# Patient Record
Sex: Male | Born: 1950
Health system: Southern US, Community
[De-identification: ages and names within clinical notes are randomized; demographics above are authoritative.]

## PROBLEM LIST (undated history)

## (undated) DIAGNOSIS — I10 Essential (primary) hypertension: Secondary | ICD-10-CM

## (undated) HISTORY — PX: HERNIA REPAIR: SHX51

---

## 2002-09-15 ENCOUNTER — Ambulatory Visit (HOSPITAL_COMMUNITY): Admission: RE | Admit: 2002-09-15 | Discharge: 2002-09-15 | Payer: Self-pay | Admitting: Internal Medicine

## 2002-09-15 ENCOUNTER — Encounter: Payer: Self-pay | Admitting: Internal Medicine

## 2003-03-05 ENCOUNTER — Ambulatory Visit (HOSPITAL_COMMUNITY): Admission: RE | Admit: 2003-03-05 | Discharge: 2003-03-05 | Payer: Self-pay | Admitting: Otolaryngology

## 2004-08-02 ENCOUNTER — Ambulatory Visit: Payer: Self-pay | Admitting: Internal Medicine

## 2004-08-14 ENCOUNTER — Ambulatory Visit: Payer: Self-pay | Admitting: Internal Medicine

## 2004-08-14 ENCOUNTER — Ambulatory Visit: Payer: Self-pay | Admitting: *Deleted

## 2004-09-06 ENCOUNTER — Ambulatory Visit: Payer: Self-pay | Admitting: Internal Medicine

## 2017-11-03 ENCOUNTER — Encounter (HOSPITAL_BASED_OUTPATIENT_CLINIC_OR_DEPARTMENT_OTHER): Payer: Self-pay | Admitting: Emergency Medicine

## 2017-11-03 ENCOUNTER — Other Ambulatory Visit: Payer: Self-pay

## 2017-11-03 ENCOUNTER — Emergency Department (HOSPITAL_BASED_OUTPATIENT_CLINIC_OR_DEPARTMENT_OTHER)
Admission: EM | Admit: 2017-11-03 | Discharge: 2017-11-03 | Disposition: A | Discharge status: Home / Self Care | Payer: BLUE CROSS/BLUE SHIELD | Attending: Emergency Medicine | Admitting: Emergency Medicine

## 2017-11-03 DIAGNOSIS — K4091 Unilateral inguinal hernia, without obstruction or gangrene, recurrent: Secondary | ICD-10-CM | POA: Insufficient documentation

## 2017-11-03 DIAGNOSIS — R109 Unspecified abdominal pain: Secondary | ICD-10-CM | POA: Diagnosis present

## 2017-11-03 DIAGNOSIS — I10 Essential (primary) hypertension: Secondary | ICD-10-CM | POA: Diagnosis not present

## 2017-11-03 HISTORY — DX: Essential (primary) hypertension: I10

## 2017-11-03 LAB — BASIC METABOLIC PANEL
Anion gap: 10 (ref 5–15)
BUN: 15 mg/dL (ref 6–20)
CO2: 29 mmol/L (ref 22–32)
Calcium: 9.7 mg/dL (ref 8.9–10.3)
Chloride: 100 mmol/L — ABNORMAL LOW (ref 101–111)
Creatinine, Ser: 1.01 mg/dL (ref 0.61–1.24)
GFR calc Af Amer: >60 mL/min (ref >60)
GFR calc non Af Amer: >60 mL/min (ref >60)
Glucose, Bld: 111 mg/dL — ABNORMAL HIGH (ref 65–99)
Potassium: 3.7 mmol/L (ref 3.5–5.1)
Sodium: 139 mmol/L (ref 135–145)

## 2017-11-03 LAB — CBC WITH DIFFERENTIAL/PLATELET
Basophils Absolute: 0 10*3/uL (ref 0.0–0.1)
Basophils Relative: 0 %
Eosinophils Absolute: 0.1 10*3/uL (ref 0.0–0.7)
Eosinophils Relative: 1 %
HCT: 43.4 % (ref 39.0–52.0)
Hemoglobin: 15.2 g/dL (ref 13.0–17.0)
Lymphocytes Relative: 14 %
Lymphs Abs: 0.8 10*3/uL (ref 0.7–4.0)
MCH: 33.9 pg (ref 26.0–34.0)
MCHC: 35 g/dL (ref 30.0–36.0)
MCV: 96.9 fL (ref 78.0–100.0)
Monocytes Absolute: 0.5 10*3/uL (ref 0.1–1.0)
Monocytes Relative: 8 %
Neutro Abs: 4.4 10*3/uL (ref 1.7–7.7)
Neutrophils Relative %: 77 %
Platelets: 165 10*3/uL (ref 150–400)
RBC: 4.48 MIL/uL (ref 4.22–5.81)
RDW: 11.5 % (ref 11.5–15.5)
WBC: 5.8 10*3/uL (ref 4.0–10.5)

## 2017-11-03 NOTE — Discharge Instructions (Signed)
Please read and follow all provided instructions.  Your diagnoses today include:  1. Unilateral recurrent inguinal hernia without obstruction or gangrene     Tests performed today include:  Blood counts and electrolytes  Blood tests to kidney function  Vital signs. See below for your results today.   Medications prescribed:   None  Take any prescribed medications only as directed.  Home care instructions:   Follow any educational materials contained in this packet.  Follow-up instructions: Please follow-up with the general surgeon listed for further evaluation of your hernia.    Return instructions:  SEEK IMMEDIATE MEDICAL ATTENTION IF:  The pain does not go away or becomes severe   A temperature above 101F develops   Repeated vomiting occurs (multiple episodes)   The pain becomes localized to portions of the abdomen. The right side could possibly be appendicitis. In an adult, the left lower portion of the abdomen could be colitis or diverticulitis.   Blood is being passed in stools or vomit (bright red or black tarry stools)   You develop chest pain, difficulty breathing, dizziness or fainting, or become confused, poorly responsive, or inconsolable (young children)  If you have any other emergent concerns regarding your health  Additional Information: Abdominal (belly) pain can be caused by many things. Your caregiver performed an examination and possibly ordered blood/urine tests and imaging (CT scan, x-rays, ultrasound). Many cases can be observed and treated at home after initial evaluation in the emergency department. Even though you are being discharged home, abdominal pain can be unpredictable. Therefore, you need a repeated exam if your pain does not resolve, returns, or worsens. Most patients with abdominal pain don't have to be admitted to the hospital or have surgery, but serious problems like appendicitis and gallbladder attacks can start out as nonspecific  pain. Many abdominal conditions cannot be diagnosed in one visit, so follow-up evaluations are very important.  Your vital signs today were: BP (!) 142/82 (BP Location: Right Arm)    Pulse 60    Temp 97.9 F (36.6 C) (Oral)    Resp 18    Ht 5\' 8"  (1.727 m)    Wt 76.2 kg (168 lb)    SpO2 100%    BMI 25.54 kg/m  If your blood pressure (bp) was elevated above 135/85 this visit, please have this repeated by your doctor within one month. --------------

## 2017-11-03 NOTE — ED Provider Notes (Signed)
[facility] EMERGENCY DEPARTMENT Provider Note   CSN: [id number] Arrival date & time: 11/03/17  0946     History   Chief Complaint Chief Complaint  Patient presents with  . Abdominal Pain    HPI Raymond Mcmillan is a 67 y.o. male.  Patient presents to the emergency department with complaint of left hernia pain.  Patient reports having a left inguinal hernia present since he was a child.  For many years this did not cause him any problems.  Moved in and out freely without difficulty or pain.  Acutely this morning, had severe pain in the left groin area that caused him to break out into a sweat.  He did not have any nausea or vomiting.  The pain was temporary and improved.  Patient noted that the hernia was able to move in and out as normal.  He presents to the emergency department for evaluation.  Currently he has no pain.  Patient denies any recent fevers, urinary symptoms, diarrhea.  He does report jogging and playing tennis on a regular basis.  He has done sit ups recently which he thinks may have exacerbated his symptoms.  He has never seen a Careers advisersurgeon for this.  Currently his primary care physician is monitoring the area. The onset of this condition was acute. The course is resolved. Alleviating factors: none.        Past Medical History:  Diagnosis Date  . Hypertension     There are no active problems to display for this patient.   Past Surgical History:  Procedure Laterality Date  . HERNIA REPAIR         Home Medications    Prior to Admission medications   Medication Sig Start Date End Date Taking? Authorizing Provider  lisinopril-hydrochlorothiazide (PRINZIDE,ZESTORETIC) 20-12.5 MG tablet Take 1 tablet by mouth daily.   Yes [provider]    Family History No family history on file.  Social History Social History   Tobacco Use  . Smoking status: Never Smoker  . Smokeless tobacco: Never Used  Substance Use Topics  . Alcohol use: Yes  .  Drug use: No     Allergies   Patient has no known allergies.   Review of Systems Review of Systems  Constitutional: Negative for fever.  HENT: Negative for rhinorrhea and sore throat.   Eyes: Negative for redness.  Respiratory: Negative for cough.   Cardiovascular: Negative for chest pain.  Gastrointestinal: Positive for abdominal pain. Negative for diarrhea, nausea and vomiting.  Genitourinary: Negative for dysuria.  Musculoskeletal: Negative for myalgias.  Skin: Negative for rash.  Neurological: Negative for headaches.     Physical Exam Updated Vital Signs BP (!) 142/82 (BP Location: Right Arm)   Pulse 60   Temp 97.9 F (36.6 C) (Oral)   Resp 18   Ht 5\' 8"  (1.727 m)   Wt 76.2 kg (168 lb)   SpO2 100%   BMI 25.54 kg/m   Physical Exam  Constitutional: He appears well-developed and well-nourished.  HENT:  Head: Normocephalic and atraumatic.  Eyes: Conjunctivae are normal. Right eye exhibits no discharge. Left eye exhibits no discharge.  Neck: Normal range of motion. Neck supple.  Cardiovascular: Normal rate, regular rhythm and normal heart sounds.  Pulmonary/Chest: Effort normal and breath sounds normal.  Abdominal: Soft. There is no tenderness. There is no rigidity, no rebound, no guarding, no CVA tenderness, no tenderness at McBurney's point and negative Murphy's sign. A hernia is present. Hernia confirmed positive in the  left inguinal area (Readily reducible with slight pressure).  Neurological: He is alert.  Skin: Skin is warm and dry.  Psychiatric: He has a normal mood and affect.  Nursing note and vitals reviewed.    ED Treatments / Results  Labs (all labs ordered are listed, but only abnormal results are displayed) Labs Reviewed  CBC WITH DIFFERENTIAL/PLATELET  BASIC METABOLIC PANEL    EKG  EKG Interpretation None       Radiology No results found.  Procedures Procedures (including critical care time)  Medications Ordered in ED Medications  - No data to display   Initial Impression / Assessment and Plan / ED Course  I have reviewed the triage vital signs and the nursing notes.  Pertinent labs & imaging results that were available during my care of the patient were reviewed by me and considered in my medical decision making (see chart for details).     Patient seen and examined.  Patient without any current signs of strangulation or herniation.  Patient and I had a good discussion about how to proceed with further evaluation and possible repair in the future.  Discussed that given his current exam, there is no indications to emergently repair this today.  I do not feel at this time that he requires advanced imaging without signs of incarceration or strangulation.  Will check a CBC and chemistry.  Suspect patient will be able to be discharged home with surgery referral and return instructions if negative.  Vital signs reviewed and are as follows: BP (!) 142/82 (BP Location: Right Arm)   Pulse 60   Temp 97.9 F (36.6 C) (Oral)   Resp 18   Ht 5\' 8"  (1.727 m)   Wt 76.2 kg (168 lb)   SpO2 100%   BMI 25.54 kg/m   10:52 AM labs reassuring.  Patient informed.  Discharge plan as above.  Final Clinical Impressions(s) / ED Diagnoses   Final diagnoses:  Unilateral recurrent inguinal hernia without obstruction or gangrene   Patient with pain this morning from a readily reducible inguinal hernia on the left without signs of strangulation or incarceration.  Patient will need general surgery follow-up.  No signs of obstruction.  Labs are reassuring.  ED Discharge Orders    None       Renne Crigler, Cordelia Poche 11/03/17 1053    Little, Ambrose Finland, MD 11/04/17 1558

## 2017-11-03 NOTE — ED Triage Notes (Signed)
Pt states he has abd pain from a hernia that is pushing through. Pt reports the pain was so bad this morning he was diaphoretic.

## 2018-02-06 DIAGNOSIS — Z0000 Encounter for general adult medical examination without abnormal findings: Secondary | ICD-10-CM | POA: Diagnosis not present

## 2018-03-26 DIAGNOSIS — H6123 Impacted cerumen, bilateral: Secondary | ICD-10-CM | POA: Diagnosis not present

## 2018-04-29 DIAGNOSIS — N390 Urinary tract infection, site not specified: Secondary | ICD-10-CM | POA: Diagnosis not present

## 2018-04-29 DIAGNOSIS — K4091 Unilateral inguinal hernia, without obstruction or gangrene, recurrent: Secondary | ICD-10-CM | POA: Diagnosis not present

## 2018-04-29 DIAGNOSIS — I10 Essential (primary) hypertension: Secondary | ICD-10-CM | POA: Diagnosis not present

## 2018-04-29 DIAGNOSIS — Z125 Encounter for screening for malignant neoplasm of prostate: Secondary | ICD-10-CM | POA: Diagnosis not present

## 2018-04-29 DIAGNOSIS — R21 Rash and other nonspecific skin eruption: Secondary | ICD-10-CM | POA: Diagnosis not present

## 2018-09-02 DIAGNOSIS — K4090 Unilateral inguinal hernia, without obstruction or gangrene, not specified as recurrent: Secondary | ICD-10-CM | POA: Diagnosis not present

## 2018-09-02 DIAGNOSIS — I10 Essential (primary) hypertension: Secondary | ICD-10-CM | POA: Diagnosis not present

## 2018-09-15 DIAGNOSIS — E785 Hyperlipidemia, unspecified: Secondary | ICD-10-CM | POA: Diagnosis not present

## 2018-09-15 DIAGNOSIS — I10 Essential (primary) hypertension: Secondary | ICD-10-CM | POA: Diagnosis not present

## 2019-03-09 DIAGNOSIS — I10 Essential (primary) hypertension: Secondary | ICD-10-CM | POA: Diagnosis not present

## 2019-03-09 DIAGNOSIS — Z7189 Other specified counseling: Secondary | ICD-10-CM | POA: Diagnosis not present

## 2019-03-09 DIAGNOSIS — E785 Hyperlipidemia, unspecified: Secondary | ICD-10-CM | POA: Diagnosis not present

## 2019-03-12 DIAGNOSIS — E785 Hyperlipidemia, unspecified: Secondary | ICD-10-CM | POA: Diagnosis not present

## 2019-03-12 DIAGNOSIS — I10 Essential (primary) hypertension: Secondary | ICD-10-CM | POA: Diagnosis not present

## 2019-04-11 DIAGNOSIS — K648 Other hemorrhoids: Secondary | ICD-10-CM | POA: Diagnosis not present

## 2019-07-20 DIAGNOSIS — M542 Cervicalgia: Secondary | ICD-10-CM | POA: Diagnosis not present

## 2019-07-27 DIAGNOSIS — M542 Cervicalgia: Secondary | ICD-10-CM | POA: Diagnosis not present

## 2019-07-28 ENCOUNTER — Other Ambulatory Visit: Payer: Self-pay | Admitting: Family Medicine

## 2019-07-28 ENCOUNTER — Ambulatory Visit
Admission: RE | Admit: 2019-07-28 | Discharge: 2019-07-28 | Disposition: A | Discharge status: Home / Self Care | Payer: Medicare Other | Source: Ambulatory Visit | Attending: Family Medicine | Admitting: Family Medicine

## 2019-07-28 ENCOUNTER — Other Ambulatory Visit: Payer: Self-pay

## 2019-07-28 DIAGNOSIS — M542 Cervicalgia: Secondary | ICD-10-CM | POA: Diagnosis not present

## 2019-08-03 DIAGNOSIS — M542 Cervicalgia: Secondary | ICD-10-CM | POA: Diagnosis not present

## 2019-08-03 DIAGNOSIS — Z0000 Encounter for general adult medical examination without abnormal findings: Secondary | ICD-10-CM | POA: Diagnosis not present

## 2019-12-26 DIAGNOSIS — M542 Cervicalgia: Secondary | ICD-10-CM | POA: Diagnosis not present

## 2020-01-09 DIAGNOSIS — R51 Headache with orthostatic component, not elsewhere classified: Secondary | ICD-10-CM | POA: Diagnosis not present

## 2020-01-12 ENCOUNTER — Other Ambulatory Visit: Payer: Self-pay | Admitting: Family Medicine

## 2020-01-12 DIAGNOSIS — R519 Headache, unspecified: Secondary | ICD-10-CM

## 2020-01-23 DIAGNOSIS — R51 Headache with orthostatic component, not elsewhere classified: Secondary | ICD-10-CM | POA: Diagnosis not present

## 2020-01-25 ENCOUNTER — Other Ambulatory Visit: Payer: Self-pay | Admitting: Family Medicine

## 2020-01-25 ENCOUNTER — Ambulatory Visit
Admission: RE | Admit: 2020-01-25 | Discharge: 2020-01-25 | Disposition: A | Discharge status: Home / Self Care | Payer: Medicare Other | Source: Ambulatory Visit | Attending: Family Medicine | Admitting: Family Medicine

## 2020-01-25 ENCOUNTER — Other Ambulatory Visit: Payer: Self-pay

## 2020-01-25 DIAGNOSIS — R519 Headache, unspecified: Secondary | ICD-10-CM | POA: Diagnosis not present

## 2020-02-05 ENCOUNTER — Other Ambulatory Visit: Payer: Medicare Other

## 2020-02-05 ENCOUNTER — Ambulatory Visit
Admission: RE | Admit: 2020-02-05 | Discharge: 2020-02-05 | Disposition: A | Discharge status: Home / Self Care | Payer: Medicare Other | Source: Ambulatory Visit | Attending: Family Medicine | Admitting: Family Medicine

## 2020-02-05 DIAGNOSIS — E042 Nontoxic multinodular goiter: Secondary | ICD-10-CM | POA: Diagnosis not present

## 2020-02-05 DIAGNOSIS — R519 Headache, unspecified: Secondary | ICD-10-CM

## 2020-02-05 MED ORDER — IOPAMIDOL (ISOVUE-370) INJECTION 76%
75.0000 mL | Freq: Once | INTRAVENOUS | Status: AC | PRN
  Administered 2020-02-05: 75 mL via INTRAVENOUS

## 2020-02-10 DIAGNOSIS — E042 Nontoxic multinodular goiter: Secondary | ICD-10-CM | POA: Diagnosis not present

## 2020-02-10 DIAGNOSIS — E039 Hypothyroidism, unspecified: Secondary | ICD-10-CM | POA: Diagnosis not present

## 2020-02-10 DIAGNOSIS — R519 Headache, unspecified: Secondary | ICD-10-CM | POA: Diagnosis not present

## 2020-04-15 DIAGNOSIS — I10 Essential (primary) hypertension: Secondary | ICD-10-CM | POA: Diagnosis not present

## 2020-04-15 DIAGNOSIS — E785 Hyperlipidemia, unspecified: Secondary | ICD-10-CM | POA: Diagnosis not present

## 2020-05-17 DIAGNOSIS — E785 Hyperlipidemia, unspecified: Secondary | ICD-10-CM | POA: Diagnosis not present

## 2020-05-17 DIAGNOSIS — I10 Essential (primary) hypertension: Secondary | ICD-10-CM | POA: Diagnosis not present

## 2020-08-16 DIAGNOSIS — I10 Essential (primary) hypertension: Secondary | ICD-10-CM | POA: Diagnosis not present

## 2020-08-16 DIAGNOSIS — E785 Hyperlipidemia, unspecified: Secondary | ICD-10-CM | POA: Diagnosis not present

## 2020-10-15 DIAGNOSIS — E785 Hyperlipidemia, unspecified: Secondary | ICD-10-CM | POA: Diagnosis not present

## 2020-10-15 DIAGNOSIS — I10 Essential (primary) hypertension: Secondary | ICD-10-CM | POA: Diagnosis not present

## 2020-10-31 DIAGNOSIS — E042 Nontoxic multinodular goiter: Secondary | ICD-10-CM | POA: Diagnosis not present

## 2020-10-31 DIAGNOSIS — E781 Pure hyperglyceridemia: Secondary | ICD-10-CM | POA: Diagnosis not present

## 2020-10-31 DIAGNOSIS — Z7189 Other specified counseling: Secondary | ICD-10-CM | POA: Diagnosis not present

## 2020-10-31 DIAGNOSIS — I10 Essential (primary) hypertension: Secondary | ICD-10-CM | POA: Diagnosis not present

## 2020-11-01 DIAGNOSIS — I10 Essential (primary) hypertension: Secondary | ICD-10-CM | POA: Diagnosis not present

## 2020-11-01 DIAGNOSIS — E785 Hyperlipidemia, unspecified: Secondary | ICD-10-CM | POA: Diagnosis not present

## 2021-03-02 DIAGNOSIS — I10 Essential (primary) hypertension: Secondary | ICD-10-CM | POA: Diagnosis not present

## 2021-03-02 DIAGNOSIS — E7800 Pure hypercholesterolemia, unspecified: Secondary | ICD-10-CM | POA: Diagnosis not present

## 2021-03-16 DIAGNOSIS — E785 Hyperlipidemia, unspecified: Secondary | ICD-10-CM | POA: Diagnosis not present

## 2021-03-16 DIAGNOSIS — I10 Essential (primary) hypertension: Secondary | ICD-10-CM | POA: Diagnosis not present

## 2021-05-17 DIAGNOSIS — E785 Hyperlipidemia, unspecified: Secondary | ICD-10-CM | POA: Diagnosis not present

## 2021-05-17 DIAGNOSIS — I10 Essential (primary) hypertension: Secondary | ICD-10-CM | POA: Diagnosis not present

## 2021-07-03 DIAGNOSIS — E785 Hyperlipidemia, unspecified: Secondary | ICD-10-CM | POA: Diagnosis not present

## 2021-07-03 DIAGNOSIS — E042 Nontoxic multinodular goiter: Secondary | ICD-10-CM | POA: Diagnosis not present

## 2021-07-03 DIAGNOSIS — I10 Essential (primary) hypertension: Secondary | ICD-10-CM | POA: Diagnosis not present

## 2021-07-10 ENCOUNTER — Other Ambulatory Visit: Payer: Self-pay | Admitting: Family Medicine

## 2021-07-10 DIAGNOSIS — E042 Nontoxic multinodular goiter: Secondary | ICD-10-CM

## 2021-07-10 DIAGNOSIS — E049 Nontoxic goiter, unspecified: Secondary | ICD-10-CM

## 2021-07-11 ENCOUNTER — Ambulatory Visit
Admission: RE | Admit: 2021-07-11 | Discharge: 2021-07-11 | Disposition: A | Discharge status: Home / Self Care | Payer: Medicare Other | Source: Ambulatory Visit | Attending: Family Medicine | Admitting: Family Medicine

## 2021-07-11 ENCOUNTER — Other Ambulatory Visit: Payer: Medicare Other

## 2021-07-11 DIAGNOSIS — E049 Nontoxic goiter, unspecified: Secondary | ICD-10-CM

## 2021-07-11 DIAGNOSIS — H6123 Impacted cerumen, bilateral: Secondary | ICD-10-CM | POA: Diagnosis not present

## 2021-07-11 DIAGNOSIS — E042 Nontoxic multinodular goiter: Secondary | ICD-10-CM

## 2021-07-11 DIAGNOSIS — E041 Nontoxic single thyroid nodule: Secondary | ICD-10-CM | POA: Diagnosis not present

## 2021-07-17 DIAGNOSIS — E785 Hyperlipidemia, unspecified: Secondary | ICD-10-CM | POA: Diagnosis not present

## 2021-07-17 DIAGNOSIS — I10 Essential (primary) hypertension: Secondary | ICD-10-CM | POA: Diagnosis not present

## 2021-07-24 IMAGING — CT CT HEAD W/O CM
1 series · 16 of 30 positions shown, 20 images · non-contrast
Comparison: None.

CLINICAL DATA: Headache

EXAM:
CT HEAD WITHOUT CONTRAST
TECHNIQUE: Contiguous axial images were obtained from the base of the skull
through the vertex without intravenous contrast.

[Series 2: head w/(date) · axial · 0.49mm/px · z∈[-167,-7]mm · 16 of 36 slices shown, 20 images]
[im 2/36  brain]
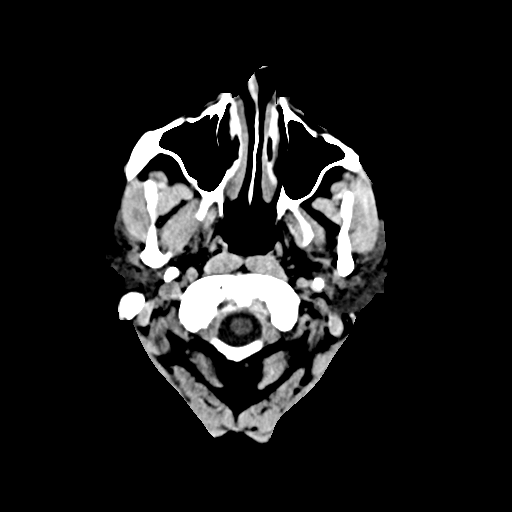
[im 2/36  bone]
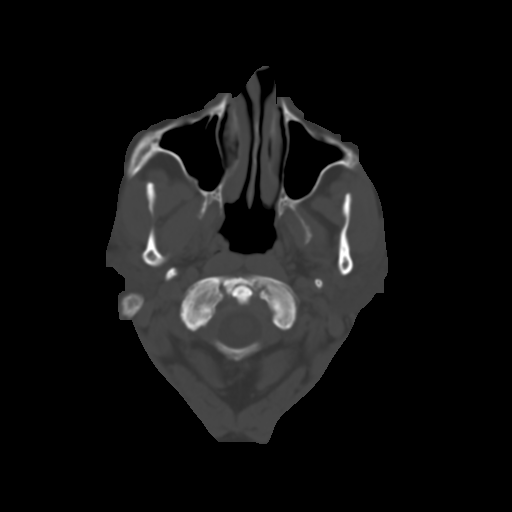
[im 4/36  brain]
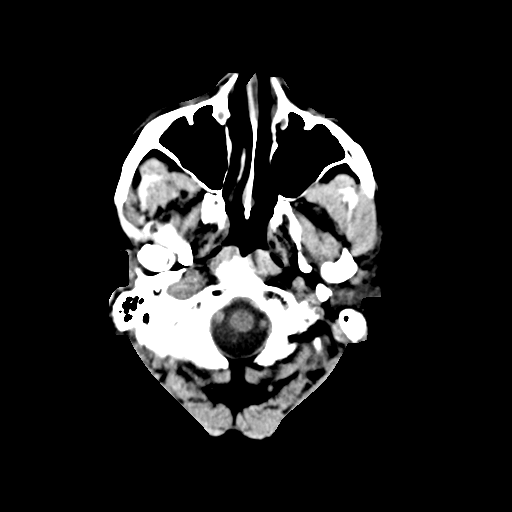
[im 7/36  brain]
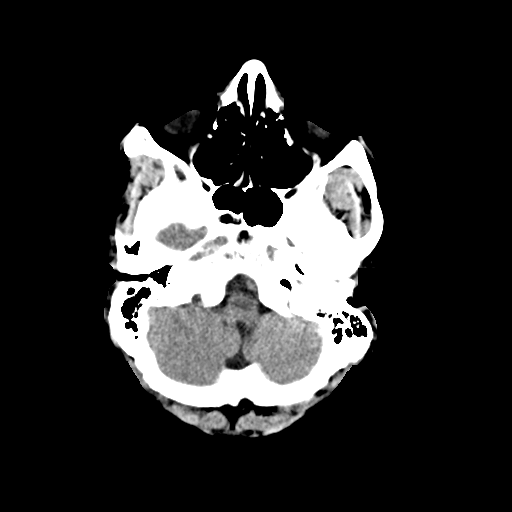
[im 9/36  brain]
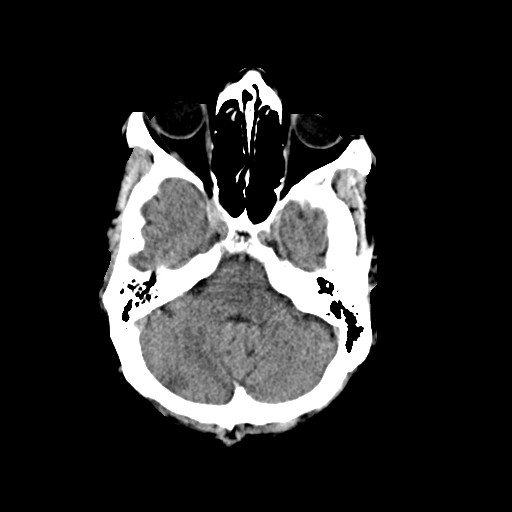
[im 10/36  brain]
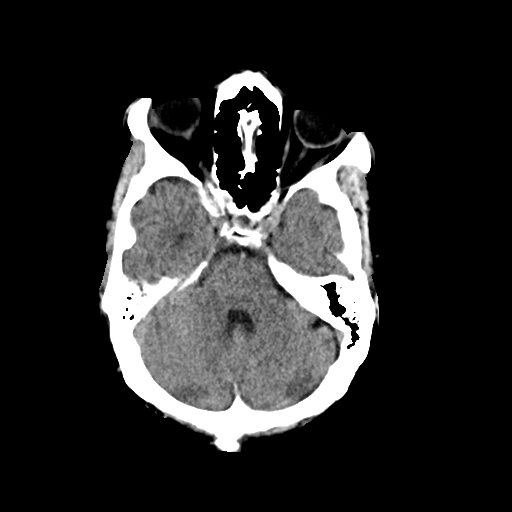
[im 10/36  bone]
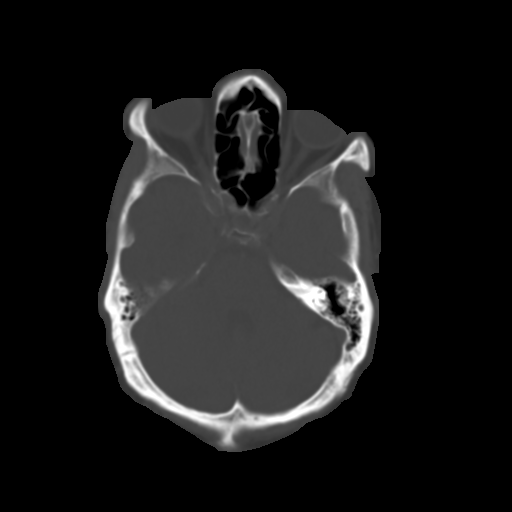
[im 13/36  brain]
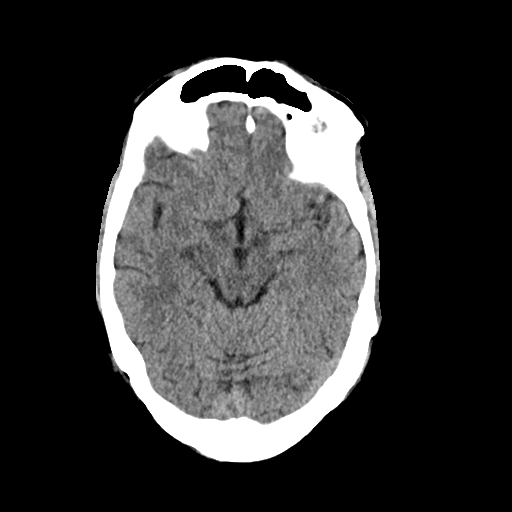
[im 15/36  brain]
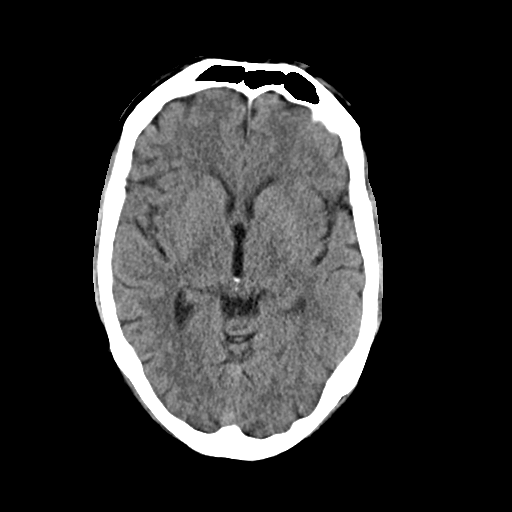
[im 17/36  brain]
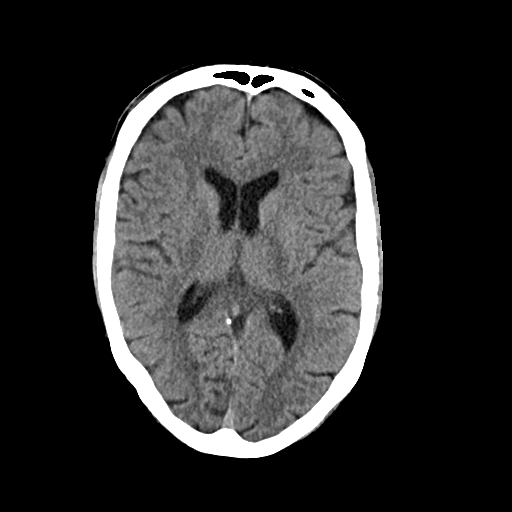
[im 19/36  brain]
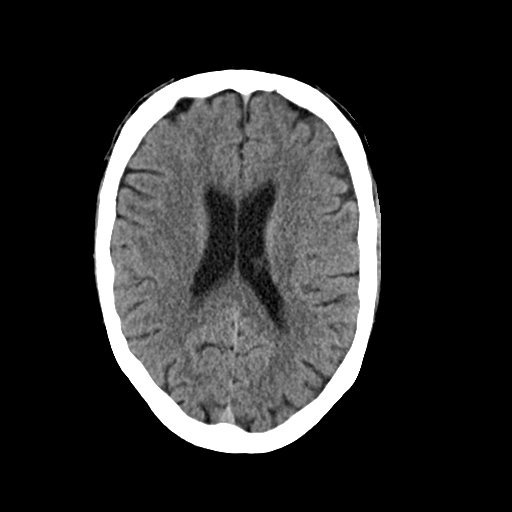
[im 19/36  bone]
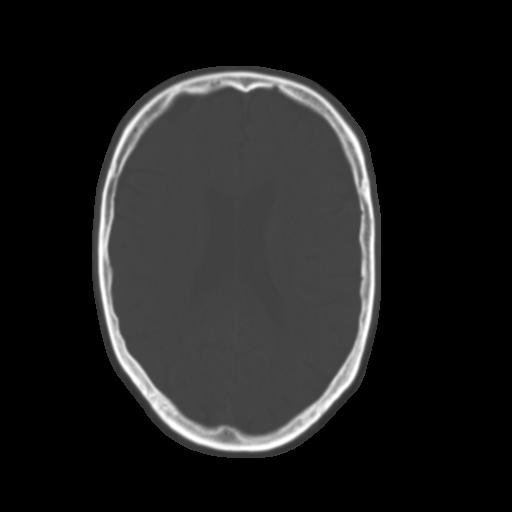
[im 21/36  brain]
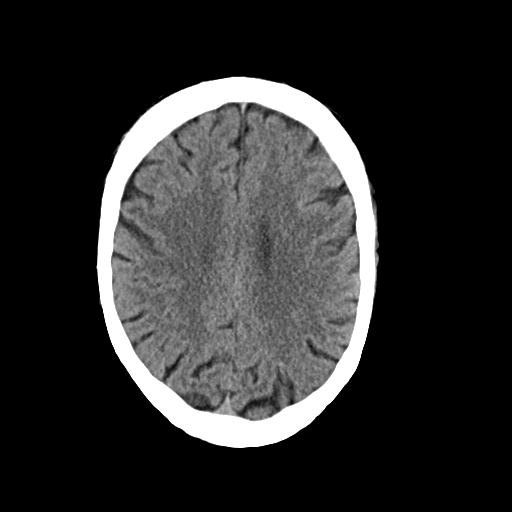
[im 23/36  brain]
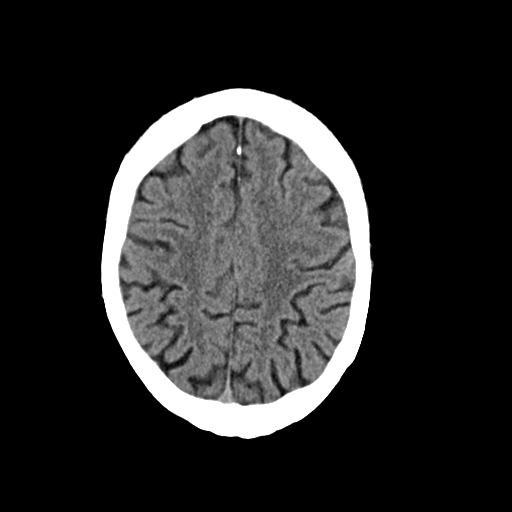
[im 26/36  brain]
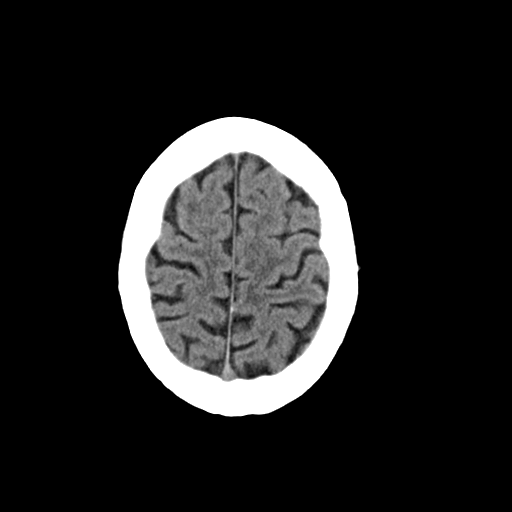
[im 27/36  brain]
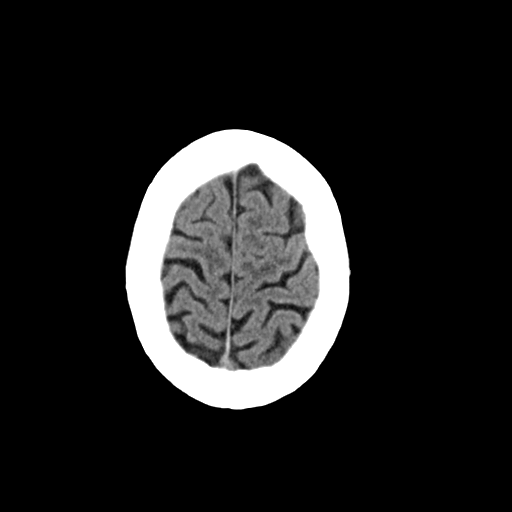
[im 27/36  bone]
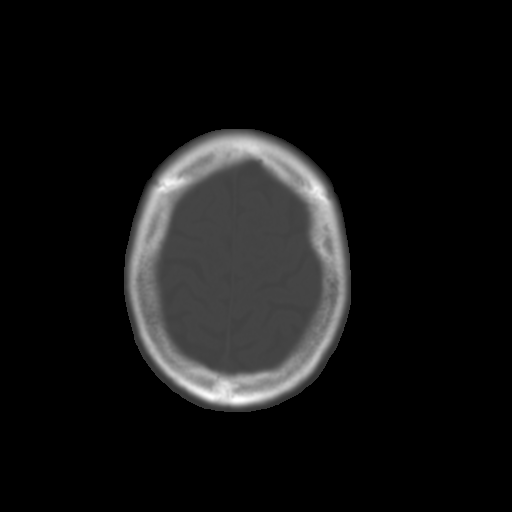
[im 29/36  brain]
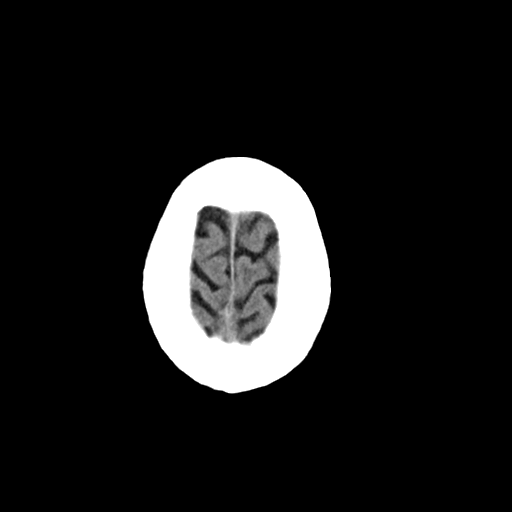
[im 32/36  brain]
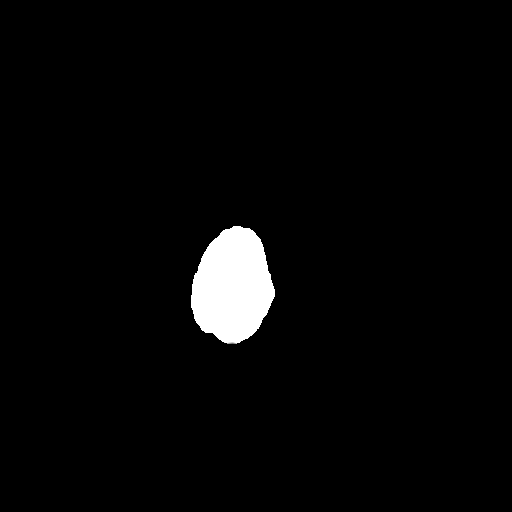
[im 34/36  brain]
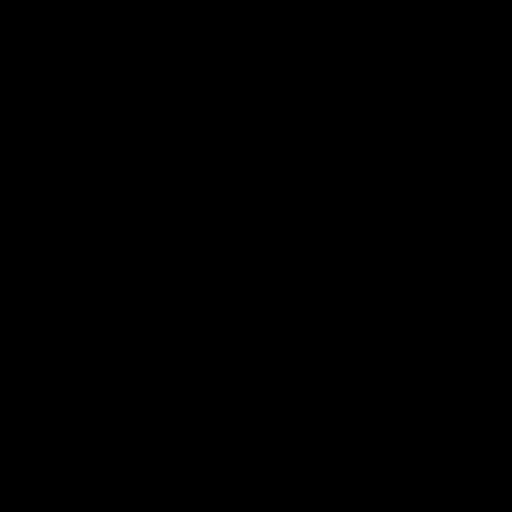

[16 of 30 positions shown; findings below may reference images not displayed]

FINDINGS: Brain: Ventricles and sulci are normal in size and configuration.
There is no intracranial mass, hemorrhage, extra-axial fluid
collection, or midline shift. Brain parenchyma appears unremarkable.
There is no demonstrable acute infarct.

Vascular: No hyperdense vessel. No appreciable vascular
calcification.

Skull: The bony calvarium appears intact.

Sinuses/Orbits: There is mild mucosal thickening in several ethmoid
air cells. Other visualized paranasal sinuses are clear. Orbits
appear symmetric bilaterally.

Other: Mastoid air cells are clear.
IMPRESSION: Mucosal thickening in several ethmoid air cells. Brain parenchyma
appears unremarkable. No acute infarct. No mass or hemorrhage.

## 2021-08-04 IMAGING — CT CT ANGIO NECK
3 of 7 series · 8 of 33 positions shown · IV contrast (iopamidol)
Comparison: None.

CLINICAL DATA: Neck pain radiating to the head. Rule out stenosis
or dissection.

EXAM:
CT ANGIOGRAPHY NECK
TECHNIQUE: Multidetector CT imaging of the neck was performed using the
standard protocol during bolus administration of intravenous
contrast. Multiplanar CT image reconstructions and MIPs were
obtained to evaluate the vascular anatomy. Carotid stenosis
measurements (when applicable) are obtained utilizing NASCET
criteria, using the distal internal carotid diameter as the
denominator.
CONTRAST:  75mL [id number] IOPAMIDOL ([id number]-NZZ) INJECTION 76%

[Series 6: cta neck 2.00 bv36 s3 cta neck (person_name) · axial · 0.37mm/px · z∈[-812,-730]mm · 2 of 124 slices shown]
[im 42/124  soft-tissue]
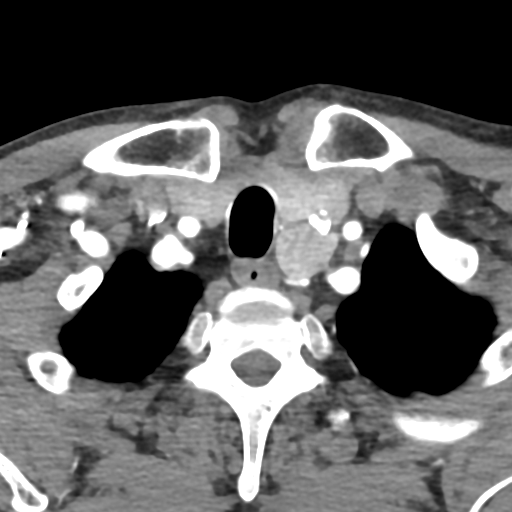
[im 83/124  soft-tissue]
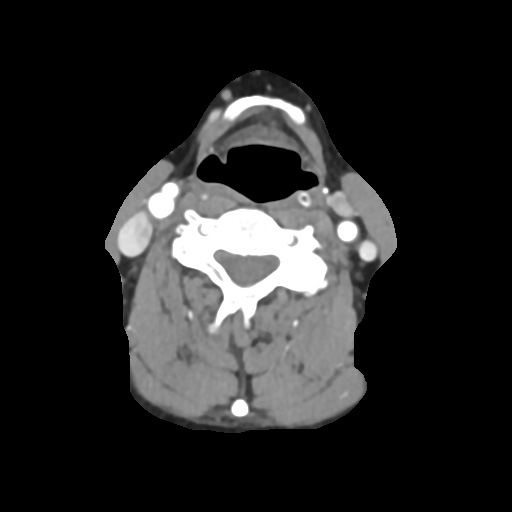

[Series 7: cta neck 1.00 bv48 s3 ax thin mips · axial · 0.37mm/px · z∈[-853,-689]mm · 5 of 248 slices shown]
[im 42/248  soft-tissue]
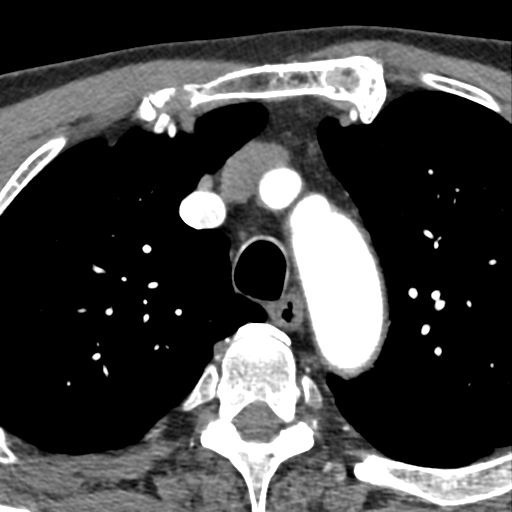
[im 83/248  bone]
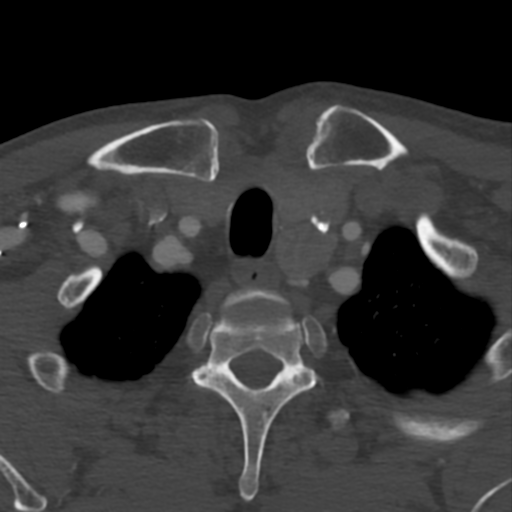
[im 124/248  soft-tissue]
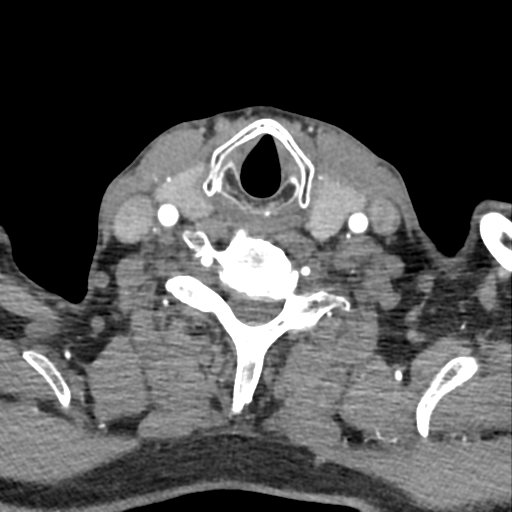
[im 165/248  bone]
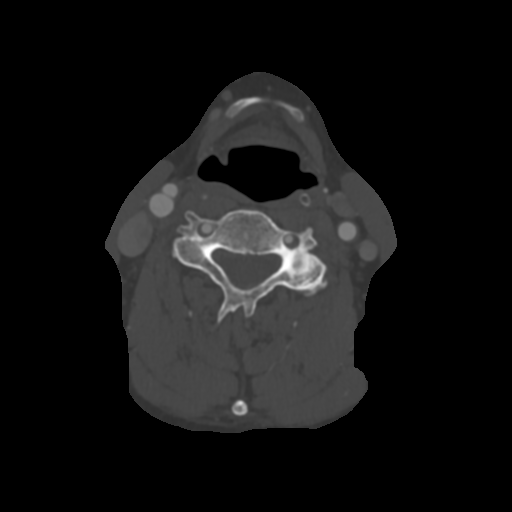
[im 206/248  soft-tissue]
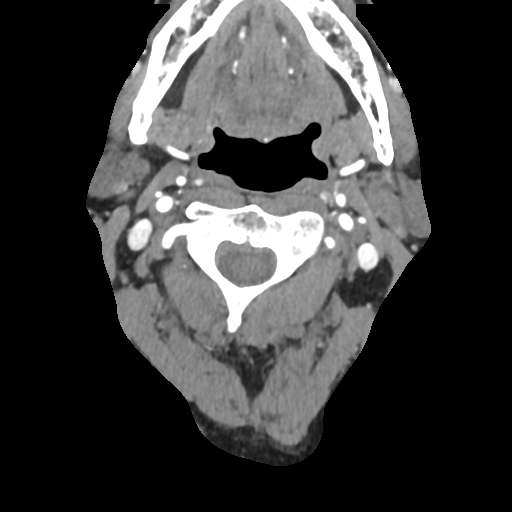

[Series 11: cta neck 1.00 bv48 s3 sag thin mips · sagittal · 0.37mm/px · 1 of 188 slices shown]
[im 94/188  soft-tissue]
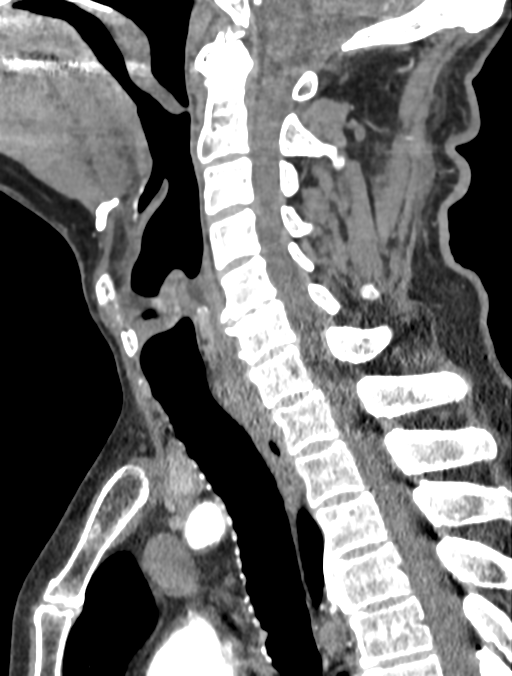

[8 of 33 positions shown; findings below may reference images not displayed]

FINDINGS: Aortic arch: Standard branching. Imaged portion shows no evidence of
aneurysm or dissection. No significant stenosis of the major arch
vessel origins.

Right carotid system: No evidence of dissection, stenosis (50% or
greater) or occlusion.

Left carotid system: No evidence of dissection, stenosis (50% or
greater) or occlusion.

Vertebral arteries: Right dominance. No evidence of dissection,
stenosis (50% or greater) or occlusion.

Skeleton: Of degenerative changes of the cervical spine more
pronounced at C5-6 where there is loss of disc height, posterior
disc osteophyte complex, uncovertebral and facet degenerative
changes resulting in severe right neural foraminal narrowing. There
is also prominent facet degenerative changes on the right side at
C7-T1.

Other neck: Enlarged multinodular thyroid.

Upper chest: Negative.
IMPRESSION: 1. No evidence of dissection, aneurysm, or hemodynamically
significant stenosis in the carotid or vertebral arteries in the
neck.
2. Enlarged multinodular thyroid. Consider further evaluation with
thyroid ultrasound.
3. Degenerative changes of the cervical spine, more pronounced at
C5-6 where there is severe right neural foraminal narrowing.

## 2021-11-06 DIAGNOSIS — I10 Essential (primary) hypertension: Secondary | ICD-10-CM | POA: Diagnosis not present

## 2021-11-06 DIAGNOSIS — E042 Nontoxic multinodular goiter: Secondary | ICD-10-CM | POA: Diagnosis not present

## 2021-11-06 DIAGNOSIS — E781 Pure hyperglyceridemia: Secondary | ICD-10-CM | POA: Diagnosis not present

## 2021-11-11 DIAGNOSIS — I10 Essential (primary) hypertension: Secondary | ICD-10-CM | POA: Diagnosis not present

## 2021-11-11 DIAGNOSIS — Z712 Person consulting for explanation of examination or test findings: Secondary | ICD-10-CM | POA: Diagnosis not present

## 2021-11-27 DIAGNOSIS — I10 Essential (primary) hypertension: Secondary | ICD-10-CM | POA: Diagnosis not present

## 2022-01-08 DIAGNOSIS — I10 Essential (primary) hypertension: Secondary | ICD-10-CM | POA: Diagnosis not present

## 2022-01-10 DIAGNOSIS — E785 Hyperlipidemia, unspecified: Secondary | ICD-10-CM | POA: Diagnosis not present

## 2022-07-08 DIAGNOSIS — R509 Fever, unspecified: Secondary | ICD-10-CM | POA: Diagnosis not present

## 2022-07-08 DIAGNOSIS — U071 COVID-19: Secondary | ICD-10-CM | POA: Diagnosis not present

## 2022-07-08 DIAGNOSIS — Z6824 Body mass index (BMI) 24.0-24.9, adult: Secondary | ICD-10-CM | POA: Diagnosis not present

## 2022-09-24 ENCOUNTER — Other Ambulatory Visit (HOSPITAL_COMMUNITY): Payer: Self-pay | Admitting: Family Medicine

## 2022-09-24 DIAGNOSIS — M79662 Pain in left lower leg: Secondary | ICD-10-CM

## 2022-09-24 DIAGNOSIS — R21 Rash and other nonspecific skin eruption: Secondary | ICD-10-CM | POA: Diagnosis not present

## 2022-09-24 DIAGNOSIS — E785 Hyperlipidemia, unspecified: Secondary | ICD-10-CM | POA: Diagnosis not present

## 2022-09-24 DIAGNOSIS — I10 Essential (primary) hypertension: Secondary | ICD-10-CM | POA: Diagnosis not present

## 2022-09-24 DIAGNOSIS — M159 Polyosteoarthritis, unspecified: Secondary | ICD-10-CM | POA: Diagnosis not present

## 2022-09-24 DIAGNOSIS — E042 Nontoxic multinodular goiter: Secondary | ICD-10-CM | POA: Diagnosis not present

## 2022-09-25 ENCOUNTER — Ambulatory Visit (HOSPITAL_COMMUNITY)
Admission: RE | Admit: 2022-09-25 | Discharge: 2022-09-25 | Disposition: A | Discharge status: Home / Self Care | Payer: Medicare Other | Source: Ambulatory Visit | Attending: Family Medicine | Admitting: Family Medicine

## 2022-09-25 DIAGNOSIS — M79662 Pain in left lower leg: Secondary | ICD-10-CM | POA: Diagnosis not present

## 2022-09-25 DIAGNOSIS — E785 Hyperlipidemia, unspecified: Secondary | ICD-10-CM | POA: Diagnosis not present

## 2022-09-25 DIAGNOSIS — I10 Essential (primary) hypertension: Secondary | ICD-10-CM | POA: Diagnosis not present

## 2022-09-25 DIAGNOSIS — R21 Rash and other nonspecific skin eruption: Secondary | ICD-10-CM | POA: Diagnosis not present

## 2022-09-25 NOTE — Progress Notes (Signed)
Left lower extremity venous duplex has been completed. Preliminary results can be found in CV Proc through chart review.  Results were faxed to Dr. Berdine Addison.  09/25/22 9:50 AM Raymond Mcmillan RVT

## 2023-04-27 DIAGNOSIS — I10 Essential (primary) hypertension: Secondary | ICD-10-CM | POA: Diagnosis not present

## 2023-04-27 DIAGNOSIS — E042 Nontoxic multinodular goiter: Secondary | ICD-10-CM | POA: Diagnosis not present

## 2023-04-27 DIAGNOSIS — E785 Hyperlipidemia, unspecified: Secondary | ICD-10-CM | POA: Diagnosis not present

## 2023-04-27 DIAGNOSIS — R634 Abnormal weight loss: Secondary | ICD-10-CM | POA: Diagnosis not present

## 2023-07-23 DIAGNOSIS — E042 Nontoxic multinodular goiter: Secondary | ICD-10-CM | POA: Diagnosis not present

## 2023-07-23 DIAGNOSIS — E785 Hyperlipidemia, unspecified: Secondary | ICD-10-CM | POA: Diagnosis not present

## 2023-10-28 DIAGNOSIS — R634 Abnormal weight loss: Secondary | ICD-10-CM | POA: Diagnosis not present

## 2023-10-28 DIAGNOSIS — I10 Essential (primary) hypertension: Secondary | ICD-10-CM | POA: Diagnosis not present

## 2023-10-28 DIAGNOSIS — E042 Nontoxic multinodular goiter: Secondary | ICD-10-CM | POA: Diagnosis not present

## 2023-10-28 DIAGNOSIS — E7800 Pure hypercholesterolemia, unspecified: Secondary | ICD-10-CM | POA: Diagnosis not present

## 2023-11-05 DIAGNOSIS — R799 Abnormal finding of blood chemistry, unspecified: Secondary | ICD-10-CM | POA: Diagnosis not present

## 2024-04-20 DIAGNOSIS — E042 Nontoxic multinodular goiter: Secondary | ICD-10-CM | POA: Diagnosis not present

## 2024-04-20 DIAGNOSIS — I10 Essential (primary) hypertension: Secondary | ICD-10-CM | POA: Diagnosis not present

## 2024-04-20 DIAGNOSIS — H6123 Impacted cerumen, bilateral: Secondary | ICD-10-CM | POA: Diagnosis not present

## 2024-04-20 DIAGNOSIS — E7800 Pure hypercholesterolemia, unspecified: Secondary | ICD-10-CM | POA: Diagnosis not present

## 2024-04-20 DIAGNOSIS — E049 Nontoxic goiter, unspecified: Secondary | ICD-10-CM | POA: Diagnosis not present

## 2024-04-20 DIAGNOSIS — Z0000 Encounter for general adult medical examination without abnormal findings: Secondary | ICD-10-CM | POA: Diagnosis not present
